# Patient Record
Sex: Male | Born: 1975 | Race: White | Hispanic: No | Marital: Single | Smoking: Never smoker
Health system: Southern US, Community
[De-identification: ages and names within clinical notes are randomized; demographics above are authoritative.]

---

## 2019-06-28 ENCOUNTER — Other Ambulatory Visit: Payer: Self-pay

## 2019-06-28 ENCOUNTER — Emergency Department (HOSPITAL_COMMUNITY)
Admission: EM | Admit: 2019-06-28 | Discharge: 2019-06-28 | Disposition: A | Discharge status: Home / Self Care | Payer: BC Managed Care – PPO | Attending: Emergency Medicine | Admitting: Emergency Medicine

## 2019-06-28 ENCOUNTER — Emergency Department (HOSPITAL_COMMUNITY): Payer: BC Managed Care – PPO

## 2019-06-28 ENCOUNTER — Encounter (HOSPITAL_COMMUNITY): Payer: Self-pay | Admitting: Emergency Medicine

## 2019-06-28 DIAGNOSIS — Y9389 Activity, other specified: Secondary | ICD-10-CM | POA: Diagnosis not present

## 2019-06-28 DIAGNOSIS — Y929 Unspecified place or not applicable: Secondary | ICD-10-CM | POA: Diagnosis not present

## 2019-06-28 DIAGNOSIS — R0789 Other chest pain: Secondary | ICD-10-CM | POA: Diagnosis not present

## 2019-06-28 DIAGNOSIS — Y999 Unspecified external cause status: Secondary | ICD-10-CM | POA: Insufficient documentation

## 2019-06-28 DIAGNOSIS — S43101A Unspecified dislocation of right acromioclavicular joint, initial encounter: Secondary | ICD-10-CM | POA: Diagnosis not present

## 2019-06-28 DIAGNOSIS — S4991XA Unspecified injury of right shoulder and upper arm, initial encounter: Secondary | ICD-10-CM | POA: Diagnosis present

## 2019-06-28 DIAGNOSIS — S299XXA Unspecified injury of thorax, initial encounter: Secondary | ICD-10-CM | POA: Insufficient documentation

## 2019-06-28 MED ORDER — HYDROCODONE-ACETAMINOPHEN 5-325 MG PO TABS: 1.0000 | ORAL_TABLET | Freq: Four times a day (QID) | ORAL | 0 refills | Status: DC | PRN

## 2019-06-28 MED ORDER — ONDANSETRON HCL 4 MG/2ML IJ SOLN: 4.0000 mg | Freq: Once | INTRAMUSCULAR | Status: DC

## 2019-06-28 MED ORDER — METHOCARBAMOL 500 MG PO TABS
500.0000 mg | ORAL_TABLET | Freq: Two times a day (BID) | ORAL | 0 refills | Status: DC
Start: 2019-06-28 — End: 2023-05-24

## 2019-06-28 MED ORDER — ONDANSETRON HCL 4 MG/2ML IJ SOLN: 4.0000 mg | Freq: Once | INTRAMUSCULAR | Status: DC | PRN

## 2019-06-28 MED ORDER — MORPHINE SULFATE (PF) 4 MG/ML IV SOLN: 4.0000 mg | Freq: Once | INTRAVENOUS | Status: DC

## 2019-06-28 MED ORDER — MORPHINE SULFATE (PF) 4 MG/ML IV SOLN: 4.0000 mg | Freq: Once | INTRAVENOUS | Status: DC | PRN

## 2019-06-28 MED ORDER — IBUPROFEN 600 MG PO TABS
600.0000 mg | ORAL_TABLET | Freq: Four times a day (QID) | ORAL | 0 refills | Status: DC | PRN
Start: 2019-06-28 — End: 2023-05-24

## 2019-06-28 MED ORDER — SODIUM CHLORIDE 0.9 % IV BOLUS
1000.0000 mL | Freq: Once | INTRAVENOUS | Status: AC
  Administered 2019-06-28: 1000 mL via INTRAVENOUS

## 2019-06-28 MED ORDER — LIDOCAINE 5 % EX PTCH: 1.0000 | MEDICATED_PATCH | CUTANEOUS | 0 refills | Status: DC
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
## 2023-05-24 ENCOUNTER — Ambulatory Visit (HOSPITAL_COMMUNITY)
Admission: EM | Admit: 2023-05-24 | Discharge: 2023-05-24 | Disposition: A | Discharge status: Home / Self Care | Attending: Family Medicine | Admitting: Family Medicine

## 2023-05-24 ENCOUNTER — Encounter (HOSPITAL_COMMUNITY): Payer: Self-pay | Admitting: Pharmacy Technician

## 2023-05-24 ENCOUNTER — Other Ambulatory Visit: Payer: Self-pay

## 2023-05-24 ENCOUNTER — Emergency Department (HOSPITAL_COMMUNITY)

## 2023-05-24 ENCOUNTER — Emergency Department (HOSPITAL_COMMUNITY)
Admission: EM | Admit: 2023-05-24 | Discharge: 2023-05-25 | Disposition: A | Discharge status: Home / Self Care | Attending: Emergency Medicine | Admitting: Emergency Medicine

## 2023-05-24 ENCOUNTER — Encounter (HOSPITAL_COMMUNITY): Payer: Self-pay

## 2023-05-24 DIAGNOSIS — R12 Heartburn: Secondary | ICD-10-CM

## 2023-05-24 DIAGNOSIS — R0602 Shortness of breath: Secondary | ICD-10-CM | POA: Insufficient documentation

## 2023-05-24 DIAGNOSIS — R42 Dizziness and giddiness: Secondary | ICD-10-CM

## 2023-05-24 DIAGNOSIS — R072 Precordial pain: Secondary | ICD-10-CM

## 2023-05-24 DIAGNOSIS — K219 Gastro-esophageal reflux disease without esophagitis: Secondary | ICD-10-CM | POA: Diagnosis not present

## 2023-05-24 LAB — CBC
HCT: 47.1 % (ref 39.0–52.0)
Hemoglobin: 16.1 g/dL (ref 13.0–17.0)
MCH: 30.4 pg (ref 26.0–34.0)
MCHC: 34.2 g/dL (ref 30.0–36.0)
MCV: 88.9 fL (ref 80.0–100.0)
Platelets: 211 10*3/uL (ref 150–400)
RBC: 5.3 MIL/uL (ref 4.22–5.81)
RDW: 12.4 % (ref 11.5–15.5)
WBC: 6.6 10*3/uL (ref 4.0–10.5)
nRBC: 0 % (ref 0.0–0.2)

## 2023-05-24 LAB — COMPREHENSIVE METABOLIC PANEL WITH GFR
ALT: 34 U/L (ref 0–44)
AST: 24 U/L (ref 15–41)
Albumin: 4.4 g/dL (ref 3.5–5.0)
Alkaline Phosphatase: 51 U/L (ref 38–126)
Anion gap: 11 (ref 5–15)
BUN: 14 mg/dL (ref 6–20)
CO2: 25 mmol/L (ref 22–32)
Calcium: 9.5 mg/dL (ref 8.9–10.3)
Chloride: 105 mmol/L (ref 98–111)
Creatinine, Ser: 1.35 mg/dL — ABNORMAL HIGH (ref 0.61–1.24)
GFR, Estimated: >60 mL/min (ref >60)
Glucose, Bld: 101 mg/dL — ABNORMAL HIGH (ref 70–99)
Potassium: 3.9 mmol/L (ref 3.5–5.1)
Sodium: 141 mmol/L (ref 135–145)
Total Bilirubin: 0.9 mg/dL (ref 0.0–1.2)
Total Protein: 7.4 g/dL (ref 6.5–8.1)

## 2023-05-24 NOTE — ED Triage Notes (Signed)
 Pt states after having a normal BM around 3pm he felt like he was going to fall forward and started sweating with SOB. Denies CP but states hx of bad acid reflux. Denies sx's now but states "I don't feel myself".

## 2023-05-24 NOTE — ED Triage Notes (Signed)
 Pt here with shob, hot sweats, dizziness, acid reflux and generally not feeling well. Symptoms started today.

## 2023-05-24 NOTE — ED Provider Notes (Signed)
 [facility]    CSN: [id number] Arrival date & time: 05/24/23  1628      History   Chief Complaint Chief Complaint  Patient presents with   Dizziness    HPI Albert Green is a 48 y.o. male.    Dizziness Here for dizziness and sweatiness that began suddenly about 3 this afternoon when he was having a bowel movement.  He did not have to strain and nothing was unusual about the BM.  He began having lightheaded dizziness and felt sweaty.  The dizziness lasted for about an hour or 2 and has subsided.  He still does not feel right and he has some burning pain in his chest, and he maybe feels like it is a little hard to get a full breath.  No cough or congestion or fever  No nausea or vomiting  He is allergic to penicillin  History reviewed. No pertinent past medical history.  There are no active problems to display for this patient.   History reviewed. No pertinent surgical history.     Home Medications    Prior to Admission medications   Not on File    Family History History reviewed. No pertinent family history.  Social History Social History   Tobacco Use   Smoking status: Never   Smokeless tobacco: Never  Substance Use Topics   Alcohol use: Not Currently   Drug use: Not Currently     Allergies   Penicillins   Review of Systems Review of Systems  Neurological:  Positive for dizziness.     Physical Exam Triage Vital Signs ED Triage Vitals  Encounter Vitals Group     BP 05/24/23 1758 (!) 158/80     Systolic BP Percentile --      Diastolic BP Percentile --      Pulse Rate 05/24/23 1758 66     Resp 05/24/23 1758 18     Temp 05/24/23 1758 98.3 F (36.8 C)     Temp Source 05/24/23 1758 Oral     SpO2 05/24/23 1758 97 %     Weight --      Height --      Head Circumference --      Peak Flow --      Pain Score 05/24/23 1759 0     Pain Loc --      Pain Education --      Exclude from Growth Chart --    No data found.  Updated  Vital Signs BP (!) 158/80 (BP Location: Right Arm)   Pulse 66   Temp 98.3 F (36.8 C) (Oral)   Resp 18   SpO2 97%   Visual Acuity Right Eye Distance:   Left Eye Distance:   Bilateral Distance:    Right Eye Near:   Left Eye Near:    Bilateral Near:     Physical Exam Vitals reviewed.  Constitutional:      General: He is not in acute distress.    Appearance: He is not ill-appearing, toxic-appearing or diaphoretic.  HENT:     Mouth/Throat:     Mouth: Mucous membranes are moist.  Eyes:     Extraocular Movements: Extraocular movements intact.     Conjunctiva/sclera: Conjunctivae normal.     Pupils: Pupils are equal, round, and reactive to light.  Cardiovascular:     Rate and Rhythm: Normal rate and regular rhythm.     Heart sounds: No murmur heard. Pulmonary:     Effort: Pulmonary effort  is normal.     Breath sounds: Normal breath sounds.  Musculoskeletal:     Cervical back: Neck supple.  Lymphadenopathy:     Cervical: No cervical adenopathy.  Skin:    Coloration: Skin is not jaundiced or pale.  Neurological:     General: No focal deficit present.     Mental Status: He is alert and oriented to person, place, and time.  Psychiatric:        Behavior: Behavior normal.      UC Treatments / Results  Labs (all labs ordered are listed, but only abnormal results are displayed) Labs Reviewed - No data to display  EKG   Radiology No results found.  Procedures Procedures (including critical care time)  Medications Ordered in UC Medications - No data to display  Initial Impression / Assessment and Plan / UC Course  I have reviewed the triage vital signs and the nursing notes.  Pertinent labs & imaging results that were available during my care of the patient were reviewed by me and considered in my medical decision making (see chart for details).     His EKG shows normal sinus rhythm with sinus rhythm variation with respiration.  There are some upsloping ST  segments in B5 and V6 but I cannot tell if there is any true ST segment elevation.  I have asked him to proceed to the emergency room for further evaluation.  An acquaintance had driven him here and dropped him off.  He will walk across heart to the emergency room. Final Clinical Impressions(s) / UC Diagnoses   Final diagnoses:  Dizziness  Heartburn  Precordial pain     Discharge Instructions      Patient will proceed to the emergency room for further evaluation of his symptoms   ED Prescriptions   None    PDMP not reviewed this encounter.   Ann Keto, MD 05/24/23 [phone]

## 2023-05-24 NOTE — ED Notes (Signed)
 Patient is being discharged from the Urgent Care and sent to the Emergency Department via POV . Per Dr. Ellsworth Haas, patient is in need of higher level of care due to dizziness and chest pain. Patient is aware and verbalizes understanding of plan of care.  Vitals:   05/24/23 1758  BP: (!) 158/80  Pulse: 66  Resp: 18  Temp: 98.3 F (36.8 C)  SpO2: 97%

## 2023-05-24 NOTE — ED Notes (Signed)
 Pt c/o burning sensation from center chest to back of throat since 3pm.

## 2023-05-24 NOTE — Discharge Instructions (Signed)
 Patient will proceed to the emergency room for further evaluation of his symptoms

## 2023-05-25 LAB — D-DIMER, QUANTITATIVE: D-Dimer, Quant: <0.27 ug{FEU}/mL (ref 0.00–0.50)

## 2023-05-25 LAB — TROPONIN I (HIGH SENSITIVITY): Troponin I (High Sensitivity): 3 ng/L (ref <18)

## 2023-05-25 MED ORDER — ALUM & MAG HYDROXIDE-SIMETH 200-200-20 MG/5ML PO SUSP
30.0000 mL | Freq: Once | ORAL | Status: AC
  Administered 2023-05-25: 30 mL via ORAL
  Filled 2023-05-25: qty 30

## 2023-05-25 NOTE — Discharge Instructions (Signed)
 It was a pleasure taking part in your care.  As discussed, your workup is reassuring.  Please return to the ED with any new or worsening symptoms.  Please follow-up with PCP which I referred you to.

## 2023-05-25 NOTE — ED Provider Notes (Signed)
 [place] EMERGENCY DEPARTMENT AT [facility] Provider Note   CSN: [id number] Arrival date & time: 05/24/23  1818     History No chief complaint on file.   Albert Green is a 48 y.o. male with no documented medical history.  Patient presents to ED for evaluation of lightheadedness, dizziness, heartburn and shortness of breath.  Reports that today he was in his [place] of health.  States he had sat down on the toilet to have a bowel movement.  Reports had a bowel movement, denies straining particularly hard, then stood up and became dizzy all of a sudden.  Describes dizziness as the room spinning, feeling drunk, stumbling when he walked.  Reports this lasted for about 6 hours.  He reports that he then developed heartburn, has history of GERD.  Reports that this sensation was very similar to past instances of heartburn.  Reports that he has felt as if he might need to throw up since this began but denies any overt nausea or vomiting.  Reports that he began to also experience shortness of breath after this began but denies any chest pain.  Denies one-sided weakness or numbness, neck pain, headache, blurred vision.  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  Denies history of DVT or PE.  Denies blood thinners.  Denies leg swelling, hemoptysis, exogenous hormone use, recent surgery.  Reports that all the symptoms have resolved.  The patient was seen in urgent care and redirected to ED due to the fact that urgent care MD was concerned that patient had EKG changes.  HPI     Home Medications Prior to Admission medications   Not on File      Allergies    Penicillins    Review of Systems   Review of Systems  Constitutional:  Negative for fever.  Respiratory:  Positive for shortness of breath.   Cardiovascular:  Negative for chest pain.  Gastrointestinal:  Positive for nausea. Negative for abdominal pain and vomiting.  Neurological:  Positive  for dizziness. Negative for syncope.  All other systems reviewed and are negative.   Physical Exam Updated Vital Signs BP 118/82 (BP Location: Right Arm)   Pulse 62   Temp 98 F (36.7 C) (Oral)   Resp 16   SpO2 100%  Physical Exam Vitals and nursing note reviewed.  Constitutional:      General: He is not in acute distress.    Appearance: He is well-developed.  HENT:     Head: Normocephalic and atraumatic.  Eyes:     Conjunctiva/sclera: Conjunctivae normal.  Cardiovascular:     Rate and Rhythm: Normal rate and regular rhythm.     Heart sounds: No murmur heard. Pulmonary:     Effort: Pulmonary effort is normal. No respiratory distress.     Breath sounds: Normal breath sounds.  Abdominal:     Palpations: Abdomen is soft.     Tenderness: There is no abdominal tenderness.  Musculoskeletal:        General: No swelling.     Cervical back: Neck supple.     Right lower leg: No edema.     Left lower leg: No edema.  Skin:    General: Skin is warm and dry.     Capillary Refill: Capillary refill takes less than 2 seconds.  Neurological:     General: No focal deficit present.     Mental Status: He is alert and oriented to person, place, and  time. Mental status is at baseline.     GCS: GCS eye subscore is 4. GCS verbal subscore is 5. GCS motor subscore is 6.     Cranial Nerves: Cranial nerves 2-12 are intact. No cranial nerve deficit.     Sensory: Sensation is intact. No sensory deficit.     Motor: Motor function is intact. No weakness.     Coordination: Coordination is intact. Heel to [facility] Test normal.     Comments: CN III through XII intact.  Intact finger-nose, heel-to-shin.  No pronator drift, no slurred speech or facial droop.  5 out of 5 strength bilateral upper extremities.  5-5 strength bilateral lower extremities.  Pupils PERRL.  Patient tracks across midline.  Psychiatric:        Mood and Affect: Mood normal.     ED Results / Procedures / Treatments   Labs (all labs  ordered are listed, but only abnormal results are displayed) Labs Reviewed  COMPREHENSIVE METABOLIC PANEL WITH GFR - Abnormal; Notable for the following components:      Result Value   Glucose, Bld 101 (*)    Creatinine, Ser 1.35 (*)    All other components within normal limits  CBC  D-DIMER, QUANTITATIVE  CBG MONITORING, ED  TROPONIN I (HIGH SENSITIVITY)    EKG None  Radiology DG Chest 2 View Result Date: 05/24/2023 CLINICAL DATA:  short of breath EXAM: CHEST - 2 VIEW COMPARISON:  Chest x-ray 06/28/2019 FINDINGS: The heart and mediastinal contours are unchanged. No focal consolidation. No pulmonary edema. No pleural effusion. No pneumothorax. No acute osseous abnormality. IMPRESSION: No active cardiopulmonary disease. Electronically Signed   By: Morgane  Naveau M.D.   On: 05/24/2023 19:10    Procedures Procedures    Medications Ordered in ED Medications  alum & mag hydroxide-simeth (MAALOX/MYLANTA) 200-200-20 MG/5ML suspension 30 mL (30 mLs Oral Given 05/25/23 0224)    ED Course/ Medical Decision Making/ A&P  Medical Decision Making Amount and/or Complexity of Data Reviewed Labs: ordered. Radiology: ordered.  Risk OTC drugs.   48 year old male presents for evaluation.  Please see HPI for further details.  On examination the patient is afebrile and nontachycardic.  His lung sounds clear bilaterally, he is not hypoxic.  Abdomen is soft and compressible.  Neurological examination at baseline without focal neurodeficits.  Overall nontoxic in appearance with reassuring vital signs.  Urgent care and be concerned about ST changes on patient EKG.  Will assess patient with CBC, CMP, troponin, D-dimer, EKG and chest x-ray.  Will provide patient with GI cocktail for heartburn.  Patient CBC without leukocytosis or anemia.  Metabolic panel with glucose 101, creatinine 1.35.  No baseline creatinine to compare this to.  Denies history of CKD.  Patient troponin 3, denies chest pain.   Patient D-dimer negative.  Chest x-ray unremarkable.  EKG nonischemic.  At this time, patient denies any complaints.  Denies any shortness of breath or chest pain.  Reports that his heartburn has relieved itself with GI cocktail.  Patient could have suffered from vasovagal episode earlier today.  Will discharge patient home, advised him to follow-up with PCP.  He reports he does not have a PCP so we will refer him to 1.  He was given strict return precautions and he voiced understanding.  Patient stable to discharge home.   Final Clinical Impression(s) / ED Diagnoses Final diagnoses:  Dizziness  Gastroesophageal reflux disease, unspecified whether esophagitis present  Shortness of breath    Rx / DC Orders ED  Discharge Orders     None         Kristin Peyer 05/25/23 0981    Rory Collard, MD 05/25/23 [id number]
